# Patient Record
Sex: Male | Born: 1990 | Hispanic: Yes | Marital: Married
Health system: Western US, Academic
[De-identification: ages and names within clinical notes are randomized; demographics above are authoritative.]

## PROBLEM LIST (undated history)

## (undated) HISTORY — PX: HERNIA REPAIR: SHX51

---

## 2020-05-22 ENCOUNTER — Emergency Department
Admission: EM | Admit: 2020-05-22 | Discharge: 2020-05-22 | Disposition: A | Discharge status: Home / Self Care | Payer: Self-pay | Attending: Emergency Medicine | Admitting: Emergency Medicine

## 2020-05-22 DIAGNOSIS — T754XXA Electrocution, initial encounter: Secondary | ICD-10-CM | POA: Insufficient documentation

## 2020-05-22 DIAGNOSIS — Y9289 Other specified places as the place of occurrence of the external cause: Secondary | ICD-10-CM | POA: Insufficient documentation

## 2020-05-22 DIAGNOSIS — R531 Weakness: Secondary | ICD-10-CM | POA: Insufficient documentation

## 2020-05-22 DIAGNOSIS — W861XXA Exposure to industrial wiring, appliances and electrical machinery, initial encounter: Secondary | ICD-10-CM | POA: Insufficient documentation

## 2020-05-22 DIAGNOSIS — Y9389 Activity, other specified: Secondary | ICD-10-CM | POA: Insufficient documentation

## 2020-05-22 DIAGNOSIS — Y990 Civilian activity done for income or pay: Secondary | ICD-10-CM | POA: Insufficient documentation

## 2020-05-22 LAB — CBC WITH DIFF, BLOOD
ANC-Automated: 6.4 10*3/uL (ref 1.6–7.0)
Abs Basophils: 0 10*3/uL (ref <0.1)
Abs Eosinophils: 0.2 10*3/uL (ref 0.0–0.5)
Abs Lymphs: 2.6 10*3/uL (ref 0.8–3.1)
Abs Monos: 0.8 10*3/uL (ref 0.2–0.8)
Basophils: 0 %
Eosinophils: 2 %
Hct: 46 % (ref 40.0–50.0)
Hgb: 15.2 gm/dL (ref 13.7–17.5)
Imm Gran %: 1 % — ABNORMAL HIGH (ref <1)
Imm Gran Abs: 0.1 10*3/uL — ABNORMAL HIGH (ref <0.1)
Lymphocytes: 26 %
MCH: 28.8 pg (ref 26.0–32.0)
MCHC: 33 g/dL (ref 32.0–36.0)
MCV: 87.1 um3 (ref 79.0–95.0)
MPV: 9.7 fL (ref 9.4–12.4)
Monocytes: 8 %
Plt Count: 319 10*3/uL (ref 140–370)
RBC: 5.28 10*6/uL (ref 4.60–6.10)
RDW: 12.6 % (ref 12.0–14.0)
Segs: 63 %
WBC: 10.2 10*3/uL — ABNORMAL HIGH (ref 4.0–10.0)

## 2020-05-22 LAB — CPK-CREATINE PHOSPHOKINASE, BLOOD: CPK: 274 U/L — ABNORMAL HIGH (ref 0–175)

## 2020-05-22 LAB — COMPREHENSIVE METABOLIC PANEL, BLOOD
ALT (SGPT): 132 U/L — ABNORMAL HIGH (ref 0–41)
AST (SGOT): 68 U/L — ABNORMAL HIGH (ref 0–40)
Albumin: 4.7 g/dL (ref 3.5–5.2)
Alkaline Phos: 90 U/L (ref 40–129)
Anion Gap: 14 mmol/L (ref 7–15)
BUN: 12 mg/dL (ref 6–20)
Bicarbonate: 23 mmol/L (ref 22–29)
Bilirubin, Tot: 0.5 mg/dL (ref <1.2)
Calcium: 9.6 mg/dL (ref 8.5–10.6)
Chloride: 105 mmol/L (ref 98–107)
Creatinine: 0.82 mg/dL (ref 0.67–1.17)
GFR: >60 mL/min
Glucose: 99 mg/dL (ref 70–99)
Potassium: 3.8 mmol/L (ref 3.5–5.1)
Sodium: 142 mmol/L (ref 136–145)
Total Protein: 7.9 g/dL (ref 6.0–8.0)

## 2020-05-22 LAB — HCV ANTIBODY WITH REFLEX QUANT: Hepatitis C Ab: NONREACTIVE

## 2020-05-22 LAB — TROPONIN T GEN 5 W/REFLEX TO CK/CKMB: Troponin T Gen 5 w/Reflex CK/CKMB: <6 ng/L (ref <22)

## 2020-05-22 NOTE — ED EKG Interpretation (Signed)
ED EKG Interpretation    EKG: Normal Sinus Rhythm with Normal Axis and no ischemic changes.

## 2020-05-22 NOTE — ED Notes (Signed)
Pt is alert & orientedx4, appears to be in NAD.  No change from baseline.  VS are stable, no pain at present.  Discharge instructions reviewed, pt demonstrated understanding.  Pt advised to return to ED for any worsening symptoms or concerns.

## 2020-05-22 NOTE — ED Provider Notes (Signed)
Emergency Department Note  [facility] electronic medical record reviewed for pertinent medical history.     Nursing Triage Note:   Chief Complaint   Patient presents with    Musculoskeletal Problem     patietn states electricutated by ungrounded wirsed while at work. patient denies CP or SOB. PMS intact x4, limited ROM, no visible signs of trauma.        HPI:   30 year old male with electrical shock at work.  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  When he touched a metal bleacher he was shocked in his left arm.  Reports he had painful movement of the left arm and feels weak in his left forearm.  Denied chest pain, palpitations, loss of consciousness.  Has full recall of event.    HPI    No past medical history on file.    No past surgical history on file.    Family History:  For past Medical/Surgical/Social/Family History, refer to HPI, unless explicitly noted here.    No family history on file.    Social History [facility]):  Lives and works in [place]    Alcohol History [facility]):  denies    Medications:   None       Allergies: Patient has no allergy information on record.    Review of Systems:   All other systems reviewed and negative unless otherwise noted in the HPI or above. This was done per my custom and practice for systems appropriate to the chief complaint in an emergency department setting and varies depending on the quality of history that the patient is able to provide.      Physical Exam:   05/22/20  1144   BP: (!) 153/45   Pulse: 103   Resp: 18   Temp: 98.6 F (37 C)   SpO2: 97%     Nursing note and vitals reviewed.     Physical Exam  Constitutional:  Pt is conversant, in NAD, resting comfortably.  Head: Normocephalic and atraumatic.   Mouth/Throat: Oropharynx is clear and moist.   Eyes: Conjunctivae and EOM are normal. Pupils are equal, round, and reactive to light.  Neck: Neck supple. No JVD present. No tracheal deviation  present.   Cardiovascular: Normalrate, regular rhythm, normal heart sounds.   Pulmonary/Chest: Non-labored, no distress, CTAB. No wheeze/rales/crackles   Abdominal: Soft, nontender, no distension, rebound or guarding. No HSM.   Musculoskeletal: . No obvious deformity.  Soft compartments left forearm.  No clear.  No overlying skin changes. MAE. Ambulating without difficulty  Extremities:  No LE edema. Intact distal pulses  Neurological: Alert and oriented to person, place, and time. No cranial nerve deficit grossly. Normal muscle tone. Coordination normal.   Skin: Skin is warm and dry. No rash noted.        Workup Review:  ED Course as of 05/24/20 1626   Berneice Gandy Deborah's Documentation   Fri May 22, 2020   1303 No rhabdo, no cardiac injury   1301 Troponin T Gen 5: <6   1301 Creatinine: 0.82   1301 CPK(!): 274           ED EKG Documentation:  ED EKG Note    No notes of this type exist for this encounter.       ED Orders:  Orders Placed This Encounter   Procedures    CBC w/ Diff Lavender    CMP (  Comprehensive Metabolic Panel)    CPK, Blood Green Plasma Separator Tube    ECG 12 Lead       Impression & ED Plan:  30 year old male with electrical shock at work.  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     Patient with subjective weakness however clinically on exam strength is intact.  His compartments are soft.  However given his subjective weakness will proceed with workup as below to assess for rhabdomyolysis or other musculoskeletal injury.  Did not lose consciousness, making associated dysrhythmia unlikely.    Orders Placed This Encounter   Procedures    CBC w/ Diff Lavender    CMP (Comprehensive Metabolic Panel)    CPK, Blood Green Plasma Separator Tube    Troponin T Gen 5 w/Reflex to CK/CKMB Green Plasma Separator Tube    ECG 12 Lead     Labs Reviewed   CBC WITH DIFF, BLOOD - Abnormal; Notable for the following  components:       Result Value    WBC 10.2 (*)     Imm Gran % 1 (*)     Imm Gran Abs 0.1 (*)     All other components within normal limits   COMPREHENSIVE METABOLIC PANEL, BLOOD - Abnormal; Notable for the following components:    AST (SGOT) 68 (*)     ALT (SGPT) 132 (*)     All other components within normal limits   CPK-CREATINE PHOSPHOKINASE, BLOOD - Abnormal; Notable for the following components:    CPK 274 (*)     All other components within normal limits   TROPONIN T GEN 5 W/REFLEX TO CK/CKMB   HCV ANTIBODY WITH REFLEX QUANT     No orders to display     ED Course as of 05/24/20 1629   Berneice Gandy Deborah's Documentation   Fri May 22, 2020   1303 No rhabdo, no cardiac injury   1301 Troponin T Gen 5: <6   1301 Creatinine: 0.82   1301 CPK(!): 274     Patient was observed here in ED and reassessed multiple times and had questions answered. No new complaints at this time. I believe that at this time patient suitable for discharge with outpatient follow up with PCP. Spoke with patient about the limitations of our testing and the possibility of need for return if symptoms worsen. Patient was given return precautions. Patient verbalizes understanding and agrees with plan. No questions at this time. Discharged in good condition.     I have discussed my evaluation and care plan for the patient with the attending physician Dr. Renard Matter, Velora Heckler, MD  Resident  05/24/20 1629       Leonides Grills, MD  06/02/20 2309

## 2020-05-22 NOTE — ED Notes (Signed)
Patient to ED following left arm injury at work. Per patient, was electrocuted by ungrounded wire. Patient denies any CP, SOB, and denies LOC. PMS intact x4. Limited ROM, with no visual signs of trauma. Patient endorses numbness and pain from left shoulder radiating down to left hand. Patient AnOx4 able to speak in clear and complete sentences.

## 2020-05-22 NOTE — Discharge Instructions (Signed)
Your labs, EKG were reassuring today.     YOU SHOULD SEEK MEDICAL ATTENTION IMMEDIATELY, EITHER HERE OR AT THE NEAREST EMERGENCY DEPARTMENT, IF ANY OF THE FOLLOWING OCCURS:  Your arms or legs feel numb or tingle.  Abnormal fluttering in the chest.  Increased pain where the electric shock was.  Any dark or tea-colored urine. This could be a sign of massive muscle injury.

## 2020-05-23 LAB — ECG 12-LEAD
ATRIAL RATE: 96 {beats}/min
ECG INTERPRETATION: NORMAL
P AXIS: 25 degrees
PR INTERVAL: 158 ms
QRS INTERVAL/DURATION: 86 ms
QT: 356 ms
QTc (Bazett): 449 ms
R AXIS: 80 degrees
T AXIS: 22 degrees
VENTRICULAR RATE: 96 {beats}/min

## 2020-11-25 ENCOUNTER — Other Ambulatory Visit: Payer: Self-pay

## 2020-11-25 ENCOUNTER — Ambulatory Visit (HOSPITAL_BASED_OUTPATIENT_CLINIC_OR_DEPARTMENT_OTHER)
Admission: RE | Admit: 2020-11-25 | Discharge: 2020-11-25 | Disposition: A | Discharge status: Home / Self Care | Payer: Self-pay | Source: Ambulatory Visit | Attending: Emergency Medicine | Admitting: Emergency Medicine

## 2020-11-25 ENCOUNTER — Ambulatory Visit
Admission: EM | Admit: 2020-11-25 | Discharge: 2020-11-25 | Disposition: A | Discharge status: Home / Self Care | Payer: Self-pay | Attending: Emergency Medicine | Admitting: Emergency Medicine

## 2020-11-25 ENCOUNTER — Encounter: Payer: Self-pay | Admitting: Emergency Medicine

## 2020-11-25 DIAGNOSIS — M25571 Pain in right ankle and joints of right foot: Secondary | ICD-10-CM | POA: Insufficient documentation

## 2020-11-25 DIAGNOSIS — W010XXA Fall on same level from slipping, tripping and stumbling without subsequent striking against object, initial encounter: Secondary | ICD-10-CM | POA: Insufficient documentation

## 2020-11-25 DIAGNOSIS — S99911A Unspecified injury of right ankle, initial encounter: Secondary | ICD-10-CM

## 2020-11-25 DIAGNOSIS — M79671 Pain in right foot: Secondary | ICD-10-CM | POA: Insufficient documentation

## 2020-11-25 MED ORDER — IBUPROFEN 800 MG PO TABS: 800.0000 mg | ORAL_TABLET | Freq: Three times a day (TID) | ORAL | 0 refills | Status: AC

## 2020-11-25 NOTE — ED Provider Notes (Signed)
[facility]    CSN: [id number] Arrival date & time: 11/25/20  1221      History   Chief Complaint Chief Complaint  Patient presents with   Ankle Pain    HPI Jeffrey Craig is a 30 y.o. male presenting today for evaluation of ankle injury.  Patient reports slipping while getting out of the shower last night causing an eversion injury to his right ankle.  Denies history of prior ankle injury.  He is in town on a work trip.  Reports pain into his foot as well.  Denies knee pain.  Denies hitting head or LOC.  HPI  History reviewed. No pertinent past medical history.  There are no problems to display for this patient.   Past Surgical History:  Procedure Laterality Date   HERNIA REPAIR         Home Medications    Prior to Admission medications   Medication Sig Start Date End Date Taking? Authorizing Provider  ibuprofen (ADVIL) 800 MG tablet Take 1 tablet (800 mg total) by mouth 3 (three) times daily. 11/25/20  Yes Alsie Younes, [place] C, PA-C    Family History No family history on file.  Social History Social History   Tobacco Use   Smoking status: Never   Smokeless tobacco: Never  Vaping Use   Vaping Use: Never used  Substance Use Topics   Alcohol use: Never   Drug use: Never     Allergies   Patient has no known allergies.   Review of Systems Review of Systems  Constitutional:  Negative for fatigue and fever.  Eyes:  Negative for redness, itching and visual disturbance.  Respiratory:  Negative for shortness of breath.   Cardiovascular:  Negative for chest pain and leg swelling.  Gastrointestinal:  Negative for nausea and vomiting.  Musculoskeletal:  Positive for arthralgias, gait problem and joint swelling. Negative for myalgias.  Skin:  Negative for color change, rash and wound.  Neurological:  Negative for dizziness, syncope, weakness, light-headedness and headaches.    Physical Exam Triage Vital Signs ED Triage Vitals  Enc Vitals Group      BP 11/25/20 1340 121/86     Pulse Rate 11/25/20 1340 94     Resp 11/25/20 1340 17     Temp 11/25/20 1340 98.1 F (36.7 C)     Temp Source 11/25/20 1340 Oral     SpO2 11/25/20 1340 95 %     Weight --      Height --      Head Circumference --      Peak Flow --      Pain Score 11/25/20 1336 7     Pain Loc --      Pain Edu? --      Excl. in GC? --    No data found.  Updated Vital Signs BP 121/86   Pulse 94   Temp 98.1 F (36.7 C) (Oral)   Resp 17   SpO2 95%   Visual Acuity Right Eye Distance:   Left Eye Distance:   Bilateral Distance:    Right Eye Near:   Left Eye Near:    Bilateral Near:     Physical Exam Vitals and nursing note reviewed.  Constitutional:      Appearance: He is well-developed.     Comments: No acute distress  HENT:     Head: Normocephalic and atraumatic.     Nose: Nose normal.  Eyes:     Conjunctiva/sclera: Conjunctivae normal.  Cardiovascular:  Rate and Rhythm: Normal rate.  Pulmonary:     Effort: Pulmonary effort is normal. No respiratory distress.  Abdominal:     General: There is no distension.  Musculoskeletal:        General: Normal range of motion.     Cervical back: Neck supple.     Comments: Right ankle: No obvious swelling or deformity, tenderness to palpation to medial malleolus extending more proximally approximately 3 to 4 cm of distal lower leg, nontender to lateral malleolus, tenderness to distal dorsum of foot along first through fourth metatarsals, dorsalis pedis 2+  Nontender at proximal lower leg/knee  Skin:    General: Skin is warm and dry.  Neurological:     Mental Status: He is alert and oriented to person, place, and time.     UC Treatments / Results  Labs (all labs ordered are listed, but only abnormal results are displayed) Labs Reviewed - No data to display  EKG   Radiology No results found.  Procedures Procedures (including critical care time)  Medications Ordered in UC Medications - No data to  display  Initial Impression / Assessment and Plan / UC Course  I have reviewed the triage vital signs and the nursing notes.  Pertinent labs & imaging results that were available during my care of the patient were reviewed by me and considered in my medical decision making (see chart for details).     Right eversion ankle injury-x-ray ordered, will go ahead and place in boot and crutches in case of fracture, will call with x-ray results and discuss further plan.  Recommended Ortho follow-up if fractured, rest ice elevation and anti-inflammatories.  Discussed strict return precautions. Patient verbalized understanding and is agreeable with plan.  Final Clinical Impressions(s) / UC Diagnoses   Final diagnoses:  Right ankle injury, initial encounter     Discharge Instructions      Please go for x-ray, I will call with results Tylenol and ibuprofen for pain and swelling Ice and elevate Nonweightbearing with crutches and boot for now until we get x-ray results Follow-up with orthopedics if fracture If no fracture-gradually ease into weightbearing, ease out of the boot and into over-the-counter ankle brace     ED Prescriptions     Medication Sig Dispense Auth. Provider   ibuprofen (ADVIL) 800 MG tablet Take 1 tablet (800 mg total) by mouth 3 (three) times daily. 21 tablet Rohn Fritsch, [place] C, PA-C      PDMP not reviewed this encounter.   Lew Dawes, [place] 11/25/20 1510

## 2020-11-25 NOTE — ED Triage Notes (Signed)
Pt is present today with right ankle pain. Pt states that he slipped on some water that ran out the shower fell backwards and twisted his right ankle. Pt states that he he heard his ankle pop and it hurts to bare weight on his ankle. Pt states that the incident happened last night

## 2020-11-25 NOTE — Discharge Instructions (Addendum)
Please go for x-ray, I will call with results Tylenol and ibuprofen for pain and swelling Ice and elevate Nonweightbearing with crutches and boot for now until we get x-ray results Follow-up with orthopedics if fracture If no fracture-gradually ease into weightbearing, ease out of the boot and into over-the-counter ankle brace

## 2022-04-06 IMAGING — DX DG FOOT COMPLETE 3+V*R*
3 series · 3 of 3 positions shown · non-contrast
Comparison: None.

CLINICAL DATA: eversion injury last night; slipped and fell, ankle
and distal foot pain

EXAM:
RIGHT ANKLE - COMPLETE 3+ VIEW; RIGHT FOOT COMPLETE - 3+ VIEW

[foot ap]
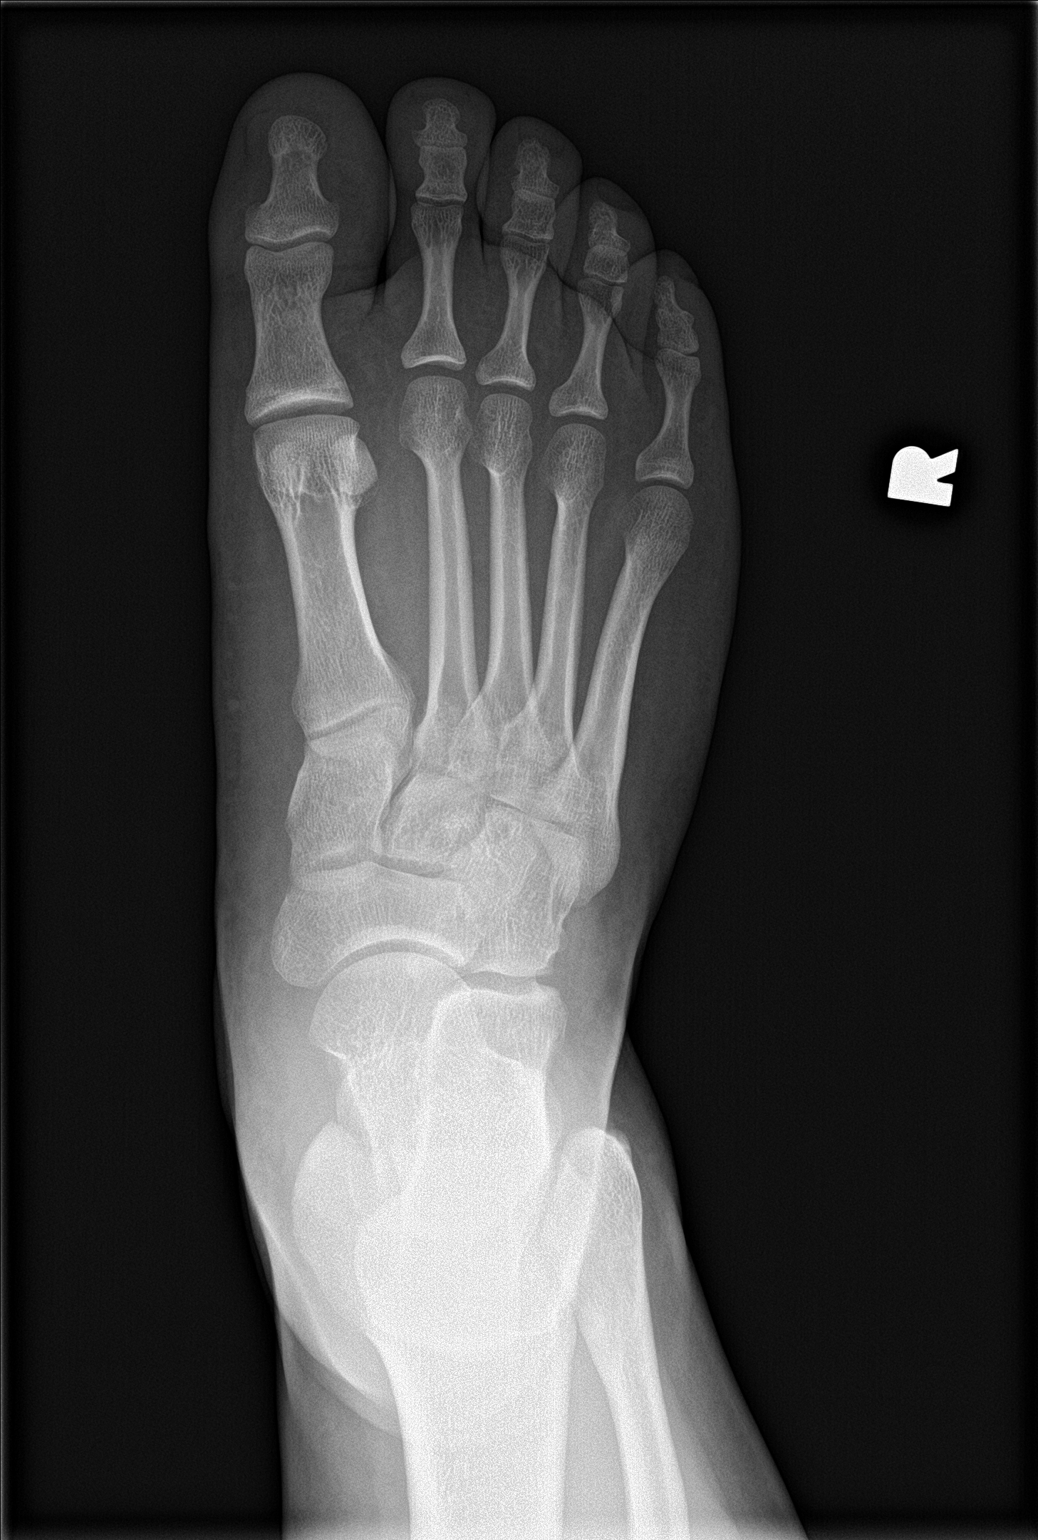

[foot obl]
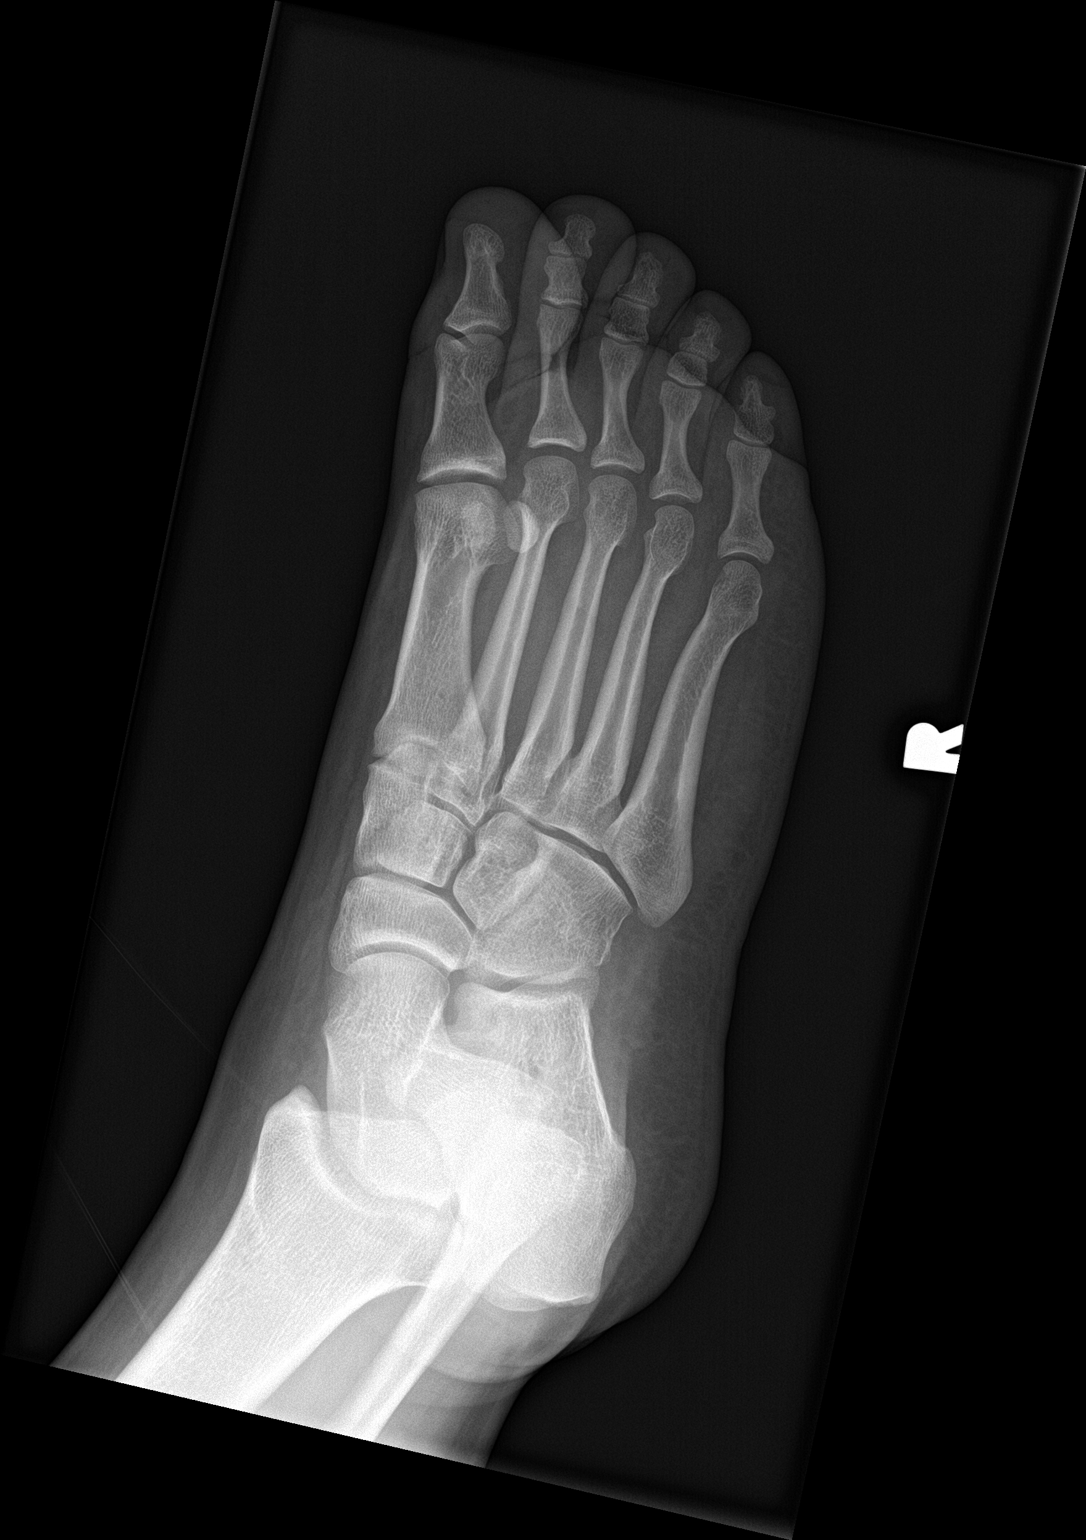

[foot lat]
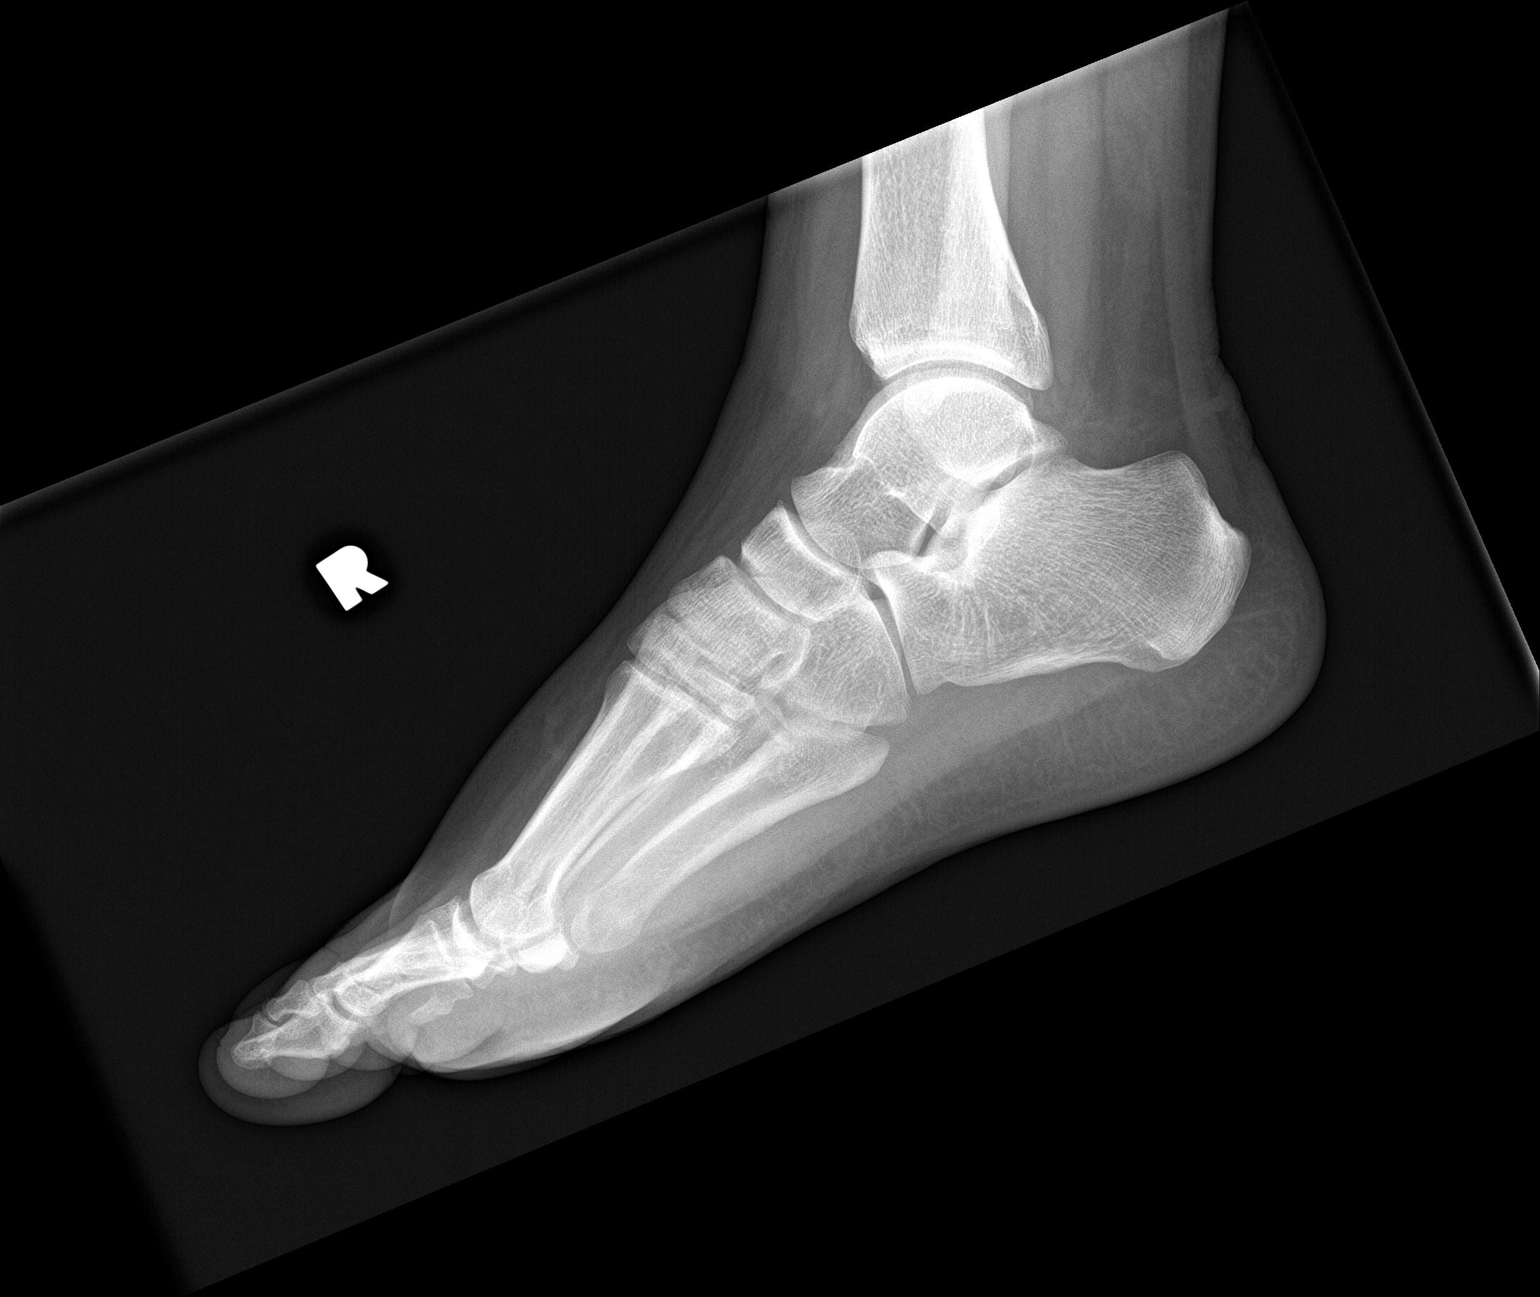

[3 of 3 positions shown; findings below may reference images not displayed]

FINDINGS: No acute fracture or dislocation. Joint spaces and alignment are
maintained. No area of erosion or osseous destruction. No unexpected
radiopaque foreign body. Mild soft tissue edema.
IMPRESSION: No acute fracture or dislocation.
